# Patient Record
Sex: Male | Born: 1995 | Race: White | Hispanic: No | Marital: Single | Smoking: Never smoker
Health system: Southern US, Community
[De-identification: ages and names within clinical notes are randomized; demographics above are authoritative.]

## PROBLEM LIST (undated history)

## (undated) HISTORY — PX: ADENOIDECTOMY: SUR15

## (undated) HISTORY — PX: TONSILLECTOMY: SUR1361

---
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
